# Patient Record
Sex: Female | Born: 1970 | Race: Black or African American | Hispanic: No | Marital: Single | Smoking: Current some day smoker
Health system: Southern US, Community
[De-identification: ages and names within clinical notes are randomized; demographics above are authoritative.]

## PROBLEM LIST (undated history)

## (undated) DIAGNOSIS — R569 Unspecified convulsions: Secondary | ICD-10-CM

## (undated) HISTORY — PX: APPENDECTOMY: SHX54

## (undated) HISTORY — PX: TUBAL LIGATION: SHX77

## (undated) HISTORY — PX: HERNIA REPAIR: SHX51

---

## 2016-05-05 ENCOUNTER — Emergency Department (HOSPITAL_BASED_OUTPATIENT_CLINIC_OR_DEPARTMENT_OTHER)
Admission: EM | Admit: 2016-05-05 | Discharge: 2016-05-05 | Disposition: A | Discharge status: Home / Self Care | Payer: Self-pay | Attending: Emergency Medicine | Admitting: Emergency Medicine

## 2016-05-05 ENCOUNTER — Encounter (HOSPITAL_BASED_OUTPATIENT_CLINIC_OR_DEPARTMENT_OTHER): Payer: Self-pay | Admitting: Emergency Medicine

## 2016-05-05 DIAGNOSIS — N760 Acute vaginitis: Secondary | ICD-10-CM | POA: Insufficient documentation

## 2016-05-05 DIAGNOSIS — F17200 Nicotine dependence, unspecified, uncomplicated: Secondary | ICD-10-CM | POA: Insufficient documentation

## 2016-05-05 DIAGNOSIS — B9689 Other specified bacterial agents as the cause of diseases classified elsewhere: Secondary | ICD-10-CM

## 2016-05-05 HISTORY — DX: Unspecified convulsions: R56.9

## 2016-05-05 LAB — URINE MICROSCOPIC-ADD ON

## 2016-05-05 LAB — URINALYSIS, ROUTINE W REFLEX MICROSCOPIC
BILIRUBIN URINE: NEGATIVE
Glucose, UA: NEGATIVE mg/dL
Ketones, ur: NEGATIVE mg/dL
Leukocytes, UA: NEGATIVE
NITRITE: NEGATIVE
PH: 6.5 (ref 5.0–8.0)
Protein, ur: NEGATIVE mg/dL
SPECIFIC GRAVITY, URINE: 1.011 (ref 1.005–1.030)

## 2016-05-05 LAB — WET PREP, GENITAL
SPERM: NONE SEEN
TRICH WET PREP: NONE SEEN
YEAST WET PREP: NONE SEEN

## 2016-05-05 LAB — PREGNANCY, URINE: Preg Test, Ur: NEGATIVE

## 2016-05-05 MED ORDER — METRONIDAZOLE 500 MG PO TABS: 500.0000 mg | ORAL_TABLET | Freq: Two times a day (BID) | ORAL | Status: AC

## 2016-05-05 MED FILL — metroNIDAZOLE 500 MG TABS: 500 | 7 days supply | Qty: 14 | Fill #0

## 2016-05-05 NOTE — ED Provider Notes (Signed)
CSN: [id number]     Arrival date & time 05/05/16  [id number] History   First MD Initiated Contact with Patient 05/05/16 [phone]     Chief Complaint  Patient presents with  . Dysuria  . Flank Pain     (Consider location/radiation/quality/duration/timing/severity/associated sxs/prior Treatment) Patient is a 45 y.o. female presenting with dysuria and flank pain. The history is provided by the patient.  Dysuria Pain quality:  Sharp and burning Pain severity:  Moderate Onset quality:  Gradual Duration:  2 days Timing:  Constant Chronicity:  New Relieved by:  Nothing Worsened by:  Nothing tried Ineffective treatments:  None tried Urinary symptoms: foul-smelling urine   Associated symptoms: flank pain (R flank) and vaginal discharge   Associated symptoms: no fever, no nausea and no vomiting   Flank Pain Pertinent negatives include no chest pain, no headaches and no shortness of breath.   45 yo F With right flank pain. This been going on for the past 3 or 4 days. Patient denies fevers or chills. Has had some vaginal discharge with this as well as dysuria and increased frequency. Patient thinks is likely urinary tract infection. Denies any vomiting or diarrhea.  Past Medical History  Diagnosis Date  . Seizures [facility](HCC)    Past Surgical History  Procedure Laterality Date  . Cesarean section    . Tubal ligation    . Appendectomy    . Hernia repair     No family history on file. Social History  Substance Use Topics  . Smoking status: Current Some Day Smoker  . Smokeless tobacco: None  . Alcohol Use: None   OB History    No data available     Review of Systems  Constitutional: Negative for fever and chills.  HENT: Negative for congestion and rhinorrhea.   Eyes: Negative for redness and visual disturbance.  Respiratory: Negative for shortness of breath and wheezing.   Cardiovascular: Negative for chest pain and palpitations.  Gastrointestinal: Negative for nausea and vomiting.   Genitourinary: Positive for urgency, flank pain (R flank) and vaginal discharge. Negative for dysuria.  Musculoskeletal: Negative for myalgias and arthralgias.  Skin: Negative for pallor and wound.  Neurological: Negative for dizziness and headaches.      Allergies  Review of patient's allergies indicates no known allergies.  Home Medications   Prior to Admission medications   Medication Sig Start Date End Date Taking? Authorizing Provider  metroNIDAZOLE (FLAGYL) 500 MG tablet Take 1 tablet (500 mg total) by mouth 2 (two) times daily. One po bid x 7 days 05/05/16   Melene Planan Governor Matos, DO   BP 132/85 mmHg  Pulse 57  Temp(Src) 99.1 F (37.3 C) (Oral)  Resp 18  Ht 5\' 6"  (1.676 m)  Wt 190 lb (86.183 kg)  BMI 30.68 kg/m2  SpO2 100%  LMP 04/17/2016 Physical Exam  Constitutional: She is oriented to person, place, and time. She appears well-developed and well-nourished. No distress.  HENT:  Head: Normocephalic and atraumatic.  Eyes: EOM are normal. Pupils are equal, round, and reactive to light.  Neck: Normal range of motion. Neck supple.  Cardiovascular: Normal rate and regular rhythm.  Exam reveals no gallop and no friction rub.   No murmur heard. Pulmonary/Chest: Effort normal. She has no wheezes. She has no rales.  Abdominal: Soft. She exhibits no distension. There is no tenderness. There is no rebound and no guarding.  Musculoskeletal: She exhibits no edema or tenderness.  Neurological: She is alert and oriented to person, place, and  time.  Skin: Skin is warm and dry. She is not diaphoretic.  Psychiatric: She has a normal mood and affect. Her behavior is normal.  Nursing note and vitals reviewed.   ED Course  Procedures (including critical care time) Labs Review Labs Reviewed  WET PREP, GENITAL - Abnormal; Notable for the following:    Clue Cells Wet Prep HPF POC PRESENT (*)    WBC, Wet Prep HPF POC MODERATE (*)    All other components within normal limits  URINALYSIS,  ROUTINE W REFLEX MICROSCOPIC (NOT AT [facility]) - Abnormal; Notable for the following:    Hgb urine dipstick TRACE (*)    All other components within normal limits  URINE MICROSCOPIC-ADD ON - Abnormal; Notable for the following:    Squamous Epithelial / LPF 0-5 (*)    Bacteria, UA FEW (*)    All other components within normal limits  PREGNANCY, URINE  GC/CHLAMYDIA PROBE AMP ([place]) NOT AT [facility]    Imaging Review No results found. I have personally reviewed and evaluated these images and lab results as part of my medical decision-making.   EKG Interpretation None      MDM   Final diagnoses:  BV (bacterial vaginosis)    45 yo F with right flank pain. Patient with a benign abdominal exam. UA negative for infection. Patient does have BV on wet prep will treat.  11:29 AM:  I have discussed the diagnosis/risks/treatment options with the patient and believe the pt to be eligible for discharge home to follow-up with PCP. We also discussed returning to the ED immediately if new or worsening sx occur. We discussed the sx which are most concerning (e.g., sudden worsening pain, fever, inability to tolerate by mouth) that necessitate immediate return. Medications administered to the patient during their visit and any new prescriptions provided to the patient are listed below.  Medications given during this visit Medications - No data to display  New Prescriptions   METRONIDAZOLE (FLAGYL) 500 MG TABLET    Take 1 tablet (500 mg total) by mouth 2 (two) times daily. One po bid x 7 days    The patient appears reasonably screen and/or stabilized for discharge and I doubt any other medical condition or other [facility] requiring further screening, evaluation, or treatment in the ED at this time prior to discharge.      Melene Plan, DO 05/05/16 1129

## 2016-05-05 NOTE — ED Notes (Signed)
Flank pain to right side.  Some dark colored urine.  Some vaginal discharge.

## 2016-05-05 NOTE — Discharge Instructions (Signed)
Take 4 over the counter ibuprofen tablets 3 times a day or 2 over-the-counter naproxen tablets twice a day for pain. Also take tylenol 1000mg (2 extra strength) four times a day.     Bacterial Vaginosis Bacterial vaginosis is a vaginal infection that occurs when the normal balance of bacteria in the vagina is disrupted. It results from an overgrowth of certain bacteria. This is the most common vaginal infection in women of childbearing age. Treatment is important to prevent complications, especially in pregnant women, as it can cause a premature delivery. CAUSES  Bacterial vaginosis is caused by an increase in harmful bacteria that are normally present in smaller amounts in the vagina. Several different kinds of bacteria can cause bacterial vaginosis. However, the reason that the condition develops is not fully understood. RISK FACTORS Certain activities or behaviors can put you at an increased risk of developing bacterial vaginosis, including:  Having a new sex partner or multiple sex partners.  Douching.  Using an intrauterine device (IUD) for contraception. Women do not get bacterial vaginosis from toilet seats, bedding, swimming pools, or contact with objects around them. SIGNS AND SYMPTOMS  Some women with bacterial vaginosis have no signs or symptoms. Common symptoms include:  Grey vaginal discharge.  A fishlike odor with discharge, especially after sexual intercourse.  Itching or burning of the vagina and vulva.  Burning or pain with urination. DIAGNOSIS  Your health care provider will take a medical history and examine the vagina for signs of bacterial vaginosis. A sample of vaginal fluid may be taken. Your health care provider will look at this sample under a microscope to check for bacteria and abnormal cells. A vaginal pH test may also be done.  TREATMENT  Bacterial vaginosis may be treated with antibiotic medicines. These may be given in the form of a pill or a vaginal  cream. A second round of antibiotics may be prescribed if the condition comes back after treatment. Because bacterial vaginosis increases your risk for sexually transmitted diseases, getting treated can help reduce your risk for chlamydia, gonorrhea, HIV, and herpes. HOME CARE INSTRUCTIONS   Only take over-the-counter or prescription medicines as directed by your health care provider.  If antibiotic medicine was prescribed, take it as directed. Make sure you finish it even if you start to feel better.  Tell all sexual partners that you have a vaginal infection. They should see their health care provider and be treated if they have problems, such as a mild rash or itching.  During treatment, it is important that you follow these instructions:  Avoid sexual activity or use condoms correctly.  Do not douche.  Avoid alcohol as directed by your health care provider.  Avoid breastfeeding as directed by your health care provider. SEEK MEDICAL CARE IF:   Your symptoms are not improving after 3 days of treatment.  You have increased discharge or pain.  You have a fever. MAKE SURE YOU:   Understand these instructions.  Will watch your condition.  Will get help right away if you are not doing well or get worse. FOR MORE INFORMATION  Centers for Disease Control and Prevention, Division of STD Prevention: SolutionApps.co.zawww.cdc.gov/std American Sexual Health Association (ASHA): www.ashastd.org    This information is not intended to replace advice given to you by your health care provider. Make sure you discuss any questions you have with your health care provider.   Document Released: 10/13/2005 Document Revised: 11/03/2014 Document Reviewed: 05/25/2013 Elsevier Interactive Patient Education [employer]2016 Elsevier Inc.

## 2016-05-06 LAB — GC/CHLAMYDIA PROBE AMP (~~LOC~~) NOT AT ARMC
CHLAMYDIA, DNA PROBE: NEGATIVE
NEISSERIA GONORRHEA: NEGATIVE

## 2016-07-08 ENCOUNTER — Encounter (HOSPITAL_BASED_OUTPATIENT_CLINIC_OR_DEPARTMENT_OTHER): Payer: Self-pay | Admitting: Emergency Medicine

## 2016-07-08 DIAGNOSIS — F17200 Nicotine dependence, unspecified, uncomplicated: Secondary | ICD-10-CM | POA: Diagnosis not present

## 2016-07-08 DIAGNOSIS — M62838 Other muscle spasm: Secondary | ICD-10-CM | POA: Diagnosis not present

## 2016-07-08 DIAGNOSIS — M25511 Pain in right shoulder: Secondary | ICD-10-CM | POA: Diagnosis present
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
## 2016-07-09 ENCOUNTER — Emergency Department (HOSPITAL_BASED_OUTPATIENT_CLINIC_OR_DEPARTMENT_OTHER)
Admission: EM | Admit: 2016-07-09 | Discharge: 2016-07-09 | Disposition: A | Discharge status: Home / Self Care | Payer: Medicaid Other | Attending: Emergency Medicine | Admitting: Emergency Medicine

## 2016-07-09 DIAGNOSIS — M62838 Other muscle spasm: Secondary | ICD-10-CM

## 2016-07-09 MED ORDER — METHOCARBAMOL 500 MG PO TABS
1000.0000 mg | ORAL_TABLET | Freq: Once | ORAL | Status: AC
  Administered 2016-07-09: 1000 mg via ORAL
  Filled 2016-07-09: qty 2

## 2016-07-09 MED ORDER — METHOCARBAMOL 500 MG PO TABS: 500.0000 mg | ORAL_TABLET | Freq: Three times a day (TID) | ORAL | 0 refills | Status: AC | PRN

## 2016-07-09 MED ORDER — KETOROLAC TROMETHAMINE 60 MG/2ML IM SOLN
60.0000 mg | Freq: Once | INTRAMUSCULAR | Status: AC
  Administered 2016-07-09: 60 mg via INTRAMUSCULAR
  Filled 2016-07-09: qty 2

## 2016-07-09 NOTE — ED Notes (Signed)
Pt verbalizes understanding of d/c instructions and denies any further needs at this time. 
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
## 2016-07-11 ENCOUNTER — Ambulatory Visit (INDEPENDENT_AMBULATORY_CARE_PROVIDER_SITE_OTHER): Payer: Self-pay | Admitting: Family Medicine

## 2016-07-11 ENCOUNTER — Encounter: Payer: Self-pay | Admitting: Family Medicine

## 2016-07-11 DIAGNOSIS — S199XXA Unspecified injury of neck, initial encounter: Secondary | ICD-10-CM

## 2016-07-11 MED ORDER — HYDROCODONE-ACETAMINOPHEN 5-325 MG PO TABS: 1.0000 | ORAL_TABLET | Freq: Four times a day (QID) | ORAL | 0 refills | Status: AC | PRN

## 2016-07-11 NOTE — Patient Instructions (Signed)
I'm concerned you have a herniated disc in your neck. We will get records from the doctors you saw in [place] then decide on next steps. These may include MRI(s), physical therapy, steroid dose pack (though it sounds like you've tried this already), other anti-inflammatories. Call me on Wednesday if you haven't heard anything from us.

## 2016-07-15 DIAGNOSIS — S199XXA Unspecified injury of neck, initial encounter: Secondary | ICD-10-CM | POA: Insufficient documentation
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
## 2016-07-22 NOTE — Addendum Note (Signed)
Addended by: Kathi SimpersWISE, Bintou Lafata F on: 07/22/2016 04:59 PM   Modules accepted: Orders

## 2016-07-25 ENCOUNTER — Encounter: Payer: Self-pay | Admitting: Family Medicine

## 2016-07-28 ENCOUNTER — Ambulatory Visit (INDEPENDENT_AMBULATORY_CARE_PROVIDER_SITE_OTHER): Payer: Self-pay

## 2016-07-28 DIAGNOSIS — M50322 Other cervical disc degeneration at C5-C6 level: Secondary | ICD-10-CM

## 2016-07-28 DIAGNOSIS — S199XXA Unspecified injury of neck, initial encounter: Secondary | ICD-10-CM

## 2016-08-01 NOTE — Addendum Note (Signed)
Addended by: Kathi SimpersWISE, Laaibah Wartman F on: 08/01/2016 08:27 AM   Modules accepted: Orders

## 2016-08-11 ENCOUNTER — Ambulatory Visit: Payer: Medicaid Other | Attending: Family Medicine | Admitting: Physical Therapy

## 2016-08-11 DIAGNOSIS — R293 Abnormal posture: Secondary | ICD-10-CM | POA: Diagnosis present

## 2016-08-11 DIAGNOSIS — M6281 Muscle weakness (generalized): Secondary | ICD-10-CM | POA: Diagnosis present

## 2016-08-11 DIAGNOSIS — M542 Cervicalgia: Secondary | ICD-10-CM | POA: Diagnosis present

## 2016-08-11 NOTE — Patient Instructions (Signed)
Scapular Retraction (Standing)    With arms at sides, pinch shoulder blades together. Repeat _10___ times per set. Do __1__ sets per session. Do __several__ sessions per day.  http://orth.exer.us/945   Copyright  VHI. All rights reserved.   Healthy Back - Shoulder Roll    Stand straight with arms relaxed at sides. Roll shoulders backward continuously. Do __10-15__ times. Do several sessions per day.  Copyright  VHI. All rights reserved.    Head Press With Chin Tuck    Tuck chin SLIGHTLY toward chest, keep mouth closed. Feel weight on back of head. Increase weight by pressing head down. Hold _2-3__ seconds. Relax. Repeat _10__ times. Surface: bed Copyright  VHI. All rights reserved.
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
## 2016-08-13 ENCOUNTER — Telehealth: Payer: Self-pay | Admitting: Family Medicine

## 2016-08-13 NOTE — Telephone Encounter (Signed)
She could try diclofenac or meloxicam for pain and inflammation.

## 2016-08-15 ENCOUNTER — Other Ambulatory Visit: Payer: Self-pay | Admitting: *Deleted

## 2016-08-15 MED ORDER — DICLOFENAC SODIUM 75 MG PO TBEC: 75.0000 mg | DELAYED_RELEASE_TABLET | Freq: Two times a day (BID) | ORAL | 0 refills | Status: AC

## 2016-08-15 NOTE — Telephone Encounter (Signed)
Patient wanted to try diclofenac. Called and said that she had a kidney stone. What will she be able to take for pain from PT?

## 2016-08-15 NOTE — Telephone Encounter (Signed)
Spoke to patient and told her prescription for diclofenac was at her pharmacy.

## 2016-08-15 NOTE — Telephone Encounter (Signed)
Kidney stones are not a potential side effect of diclofenac.

## 2016-08-19 ENCOUNTER — Ambulatory Visit: Payer: Medicaid Other

## 2016-08-25 ENCOUNTER — Ambulatory Visit: Payer: Medicaid Other

## 2016-08-28 ENCOUNTER — Ambulatory Visit: Payer: Medicaid Other | Attending: Family Medicine | Admitting: Physical Therapy

## 2016-08-28 DIAGNOSIS — M542 Cervicalgia: Secondary | ICD-10-CM | POA: Diagnosis not present

## 2016-08-28 DIAGNOSIS — M6281 Muscle weakness (generalized): Secondary | ICD-10-CM | POA: Diagnosis present

## 2016-08-28 DIAGNOSIS — R293 Abnormal posture: Secondary | ICD-10-CM | POA: Diagnosis present
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
## 2016-08-28 NOTE — Patient Instructions (Signed)
   SHOULDER: External Rotation - Supine (Cane)   Hold cane with both hands. Rotate arm away from body. Keep elbow on floor and next to body. _15__ reps per set, _1__ sets per day, _7__ days per week Add towel to keep elbow at side.  Copyright  VHI. All rights reserved.       Cane Exercise: Flexion   Lie on back, holding cane above chest. Keeping arms as straight as possible, lower cane toward floor beyond head. Hold __1-2_ seconds. Repeat __15__ times. Do _1___ sessions per day.  http://gt2.exer.us/91   Copyright  VHI. All rights reserved.   Cane Exercise: Abduction    Lie on your back.  Hold cane with right hand over end, palm-up, with other hand palm-down. Move arm out from side and up by pushing with other arm. Hold _1-2___ seconds. Repeat __15__ times. Do _1___ sessions per day.  http://gt2.exer.us/82   Copyright  VHI. All rights reserved.

## 2016-09-01 ENCOUNTER — Ambulatory Visit: Payer: Medicaid Other

## 2016-09-04 ENCOUNTER — Ambulatory Visit: Payer: Medicaid Other | Admitting: Physical Therapy

## 2016-09-04 DIAGNOSIS — M6281 Muscle weakness (generalized): Secondary | ICD-10-CM

## 2016-09-04 DIAGNOSIS — M542 Cervicalgia: Secondary | ICD-10-CM | POA: Diagnosis not present

## 2016-09-04 DIAGNOSIS — R293 Abnormal posture: Secondary | ICD-10-CM
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
## 2016-09-08 ENCOUNTER — Ambulatory Visit: Payer: Medicaid Other

## 2016-09-11 ENCOUNTER — Ambulatory Visit: Payer: Medicaid Other
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
